# Patient Record
Sex: Female | Born: 2000 | Hispanic: Yes | Marital: Married | Smoking: Never smoker
Health system: Southern US, Community
[De-identification: ages and names within clinical notes are randomized; demographics above are authoritative.]

## PROBLEM LIST (undated history)

## (undated) DIAGNOSIS — Z789 Other specified health status: Secondary | ICD-10-CM

## (undated) HISTORY — PX: NO PAST SURGERIES: SHX2092

## (undated) HISTORY — DX: Other specified health status: Z78.9

---

## 2019-12-24 ENCOUNTER — Inpatient Hospital Stay (HOSPITAL_COMMUNITY)
Admission: AD | Admit: 2019-12-24 | Discharge: 2019-12-24 | Disposition: A | Discharge status: Home / Self Care | Payer: Self-pay | Attending: Obstetrics & Gynecology | Admitting: Obstetrics & Gynecology

## 2019-12-24 ENCOUNTER — Other Ambulatory Visit: Payer: Self-pay

## 2019-12-24 DIAGNOSIS — Z3202 Encounter for pregnancy test, result negative: Secondary | ICD-10-CM | POA: Insufficient documentation

## 2019-12-24 DIAGNOSIS — N939 Abnormal uterine and vaginal bleeding, unspecified: Secondary | ICD-10-CM | POA: Insufficient documentation

## 2019-12-24 LAB — POCT PREGNANCY, URINE: Preg Test, Ur: NEGATIVE

## 2019-12-24 LAB — HCG, QUANTITATIVE, PREGNANCY: hCG, Beta Chain, Quant, S: <1 m[IU]/mL (ref <5)

## 2019-12-24 NOTE — MAU Note (Signed)
+  HPT a couple days ago. Yesterday and today has been spotting, bright red today. Few small clots today. Not really pain, but uncomfortable in lower abd.

## 2019-12-24 NOTE — MAU Provider Note (Signed)
First Provider Initiated Contact with Patient 12/24/19 1707     S [name] is a 19 y.o. G0 P0 female who presents to MAU today with complaint of vaginal spotting in the setting of positive home pregnancy test.  O BP 113/66 (BP Location: Right Arm)   Pulse 93   Temp 99.3 F (37.4 C) (Oral)   Resp 16   Wt 67.1 kg   LMP 11/27/2019   SpO2 100%    Physical Exam  Nursing note and vitals reviewed. Constitutional: She is oriented to person, place, and time. She appears well-developed and well-nourished.  Cardiovascular: Normal rate and normal heart sounds.  Respiratory: Effort normal and breath sounds normal.  GI: Soft.  Neurological: She is alert and oriented to person, place, and time.  Skin: Skin is warm and dry.  Psychiatric: She has a normal mood and affect. Her behavior is normal. Judgment and thought content normal.   A Negative urine pregnancy test Quant hCG < 1 Medical screening exam complete  P Discharge from MAU in stable condition List of options for follow-up given  Warning signs for worsening condition that would warrant emergency follow-up discussed Patient may return to MAU as needed for pregnancy related complaints  Language barrier: stratus remote interpreter utilized for all patient interaction  Clayton Bibles, CNM 12/24/2019 7:10 PM

## 2020-05-25 ENCOUNTER — Other Ambulatory Visit: Payer: Self-pay | Admitting: Obstetrics & Gynecology

## 2020-05-25 DIAGNOSIS — Z3682 Encounter for antenatal screening for nuchal translucency: Secondary | ICD-10-CM

## 2020-05-25 DIAGNOSIS — Z3A10 10 weeks gestation of pregnancy: Secondary | ICD-10-CM

## 2020-06-12 ENCOUNTER — Encounter: Payer: Self-pay | Admitting: *Deleted

## 2020-06-17 ENCOUNTER — Ambulatory Visit: Payer: Self-pay

## 2020-06-17 ENCOUNTER — Other Ambulatory Visit: Payer: Self-pay

## 2020-06-18 ENCOUNTER — Ambulatory Visit: Payer: Self-pay | Attending: Obstetrics and Gynecology | Admitting: *Deleted

## 2020-06-18 ENCOUNTER — Other Ambulatory Visit: Payer: Self-pay

## 2020-06-18 ENCOUNTER — Ambulatory Visit (HOSPITAL_BASED_OUTPATIENT_CLINIC_OR_DEPARTMENT_OTHER): Payer: Self-pay

## 2020-06-18 ENCOUNTER — Ambulatory Visit: Payer: Self-pay

## 2020-06-18 VITALS — BP 111/76 | HR 95 | Wt 162.0 lb

## 2020-06-18 DIAGNOSIS — Z3A10 10 weeks gestation of pregnancy: Secondary | ICD-10-CM

## 2020-06-18 DIAGNOSIS — Z3682 Encounter for antenatal screening for nuchal translucency: Secondary | ICD-10-CM

## 2020-06-20 LAB — FIRST TRIMESTER SCREEN W/NT
CRL: 81.6 mm
DIA MoM: 2.16
DIA Value: 391.2 pg/mL
Gest Age-Collect: 13.7 weeks
Maternal Age At EDD: 22.1 yr
Nuchal Translucency MoM: 1.43
Nuchal Translucency: 2.6 mm
Number of Fetuses: 1
PAPP-A MoM: 1.51
PAPP-A Value: 2107.4 ng/mL
Test Results:: NEGATIVE
Weight: 162 [lb_av]
hCG MoM: 1.8
hCG Value: 130.1 IU/mL

## 2020-06-23 ENCOUNTER — Ambulatory Visit: Payer: Self-pay | Admitting: Genetic Counselor

## 2020-06-23 ENCOUNTER — Ambulatory Visit: Payer: Self-pay | Attending: Obstetrics and Gynecology | Admitting: Genetic Counselor

## 2020-06-23 ENCOUNTER — Other Ambulatory Visit: Payer: Self-pay

## 2020-06-23 DIAGNOSIS — Z315 Encounter for genetic counseling: Secondary | ICD-10-CM

## 2020-06-23 DIAGNOSIS — O09299 Supervision of pregnancy with other poor reproductive or obstetric history, unspecified trimester: Secondary | ICD-10-CM

## 2020-06-23 NOTE — Progress Notes (Signed)
06/23/2020  Alyssa Morrison 2000/12/29 MRN: [id number] DOV: 06/23/2020  Alyssa Morrison [facility] for a genetics consultation regarding her history of a previous fetus with multiple anomalies. Alyssa Morrison presented to her appointment alone. This session was facilitated by a [facility] Spanish interpreter.   Indication for genetic counseling - Previous fetus with multiple anomalies   Prenatal history  Alyssa Morrison is a G2P0010, 19 y.o. female. Her current pregnancy has completed 3440w1d (Estimated Date of Delivery: 12/21/20). This pregnancy is Alyssa Morrison'Alyssa Morrison second with her current partner. Her previous pregnancy resulted in a miscarriage at 2317 weeks' gestation per Alyssa Morrison.  Alyssa Morrison denied exposure to environmental toxins or chemical agents. She denied the use of alcohol, tobacco or street drugs. She reported taking prenatal vitamins. She denied significant viral illnesses, fevers, and bleeding during the course of her pregnancy. Her medical and surgical histories were noncontributory.  Family History  A three generation pedigree was drafted and reviewed. The family history is remarkable for the following:  - Alyssa Morrison had a previous pregnancy that was affected by multiple anomalies, including "water on the brain" (hydrocephalus) and "throat deformities". This pregnancy resulted in miscarriage at 7317 weeks' gestation. Genetic testing was not performed to assess for a cause of the anomalies. See Discussion section for more details  - Alyssa Morrison has a 21 year old brother with Down syndrome. He has mild seizures but is reportedly doing well otherwise. Alyssa Morrison believes that her mother had her brother when she was around 19 years of age. We reviewed that Down syndrome is most often caused by an entire extra copy of chromosome 21. This occurs due to errors in chromosomal division  during the creation of egg and sperm cells in a process called nondisjunction. Given that [name]'Alyssa Morrison mother was considered to be of advanced maternal age when she had her son, it is likely that Down syndrome occurred due to a random nondisjunction event. If this is the case, it is not expected that there would be an increased risk of recurrence for Down syndrome in [name]'Alyssa Morrison children. However, all women have age-related risks to have a child with a chromosomal aneuploidy such as Down syndrome, and screening is available to assess the risk for Down syndrome in a pregnancy. See Discussion section for more details.  The remaining family histories were reviewed and found to be noncontributory for birth defects, intellectual disability, recurrent pregnancy loss, and known genetic conditions.    The patient'Alyssa Morrison ethnicity is [place]. The father of the pregnancy'Alyssa Morrison ethnicity is [place]. Ashkenazi Jewish ancestry was denied. Alyssa Morrison reported that her paternal grandfather is cousins with her partner'Alyssa Morrison father. The precise degree of relation between them is not known. Pedigree will be scanned under Media.  Discussion  Previous pregnancy with fetal anomalies:  Alyssa Morrison was referred for genetic counseling due to her history of a previous pregnancy with fetal anomalies. The fetus in her first pregnancy reportedly had "throat deformities" and hydrocephalus.  This pregnancy resulted in miscarriage at 7017 weeks' gestation. This pregnancy occurred in [place] so records are unavailable and details are limited. Alyssa Morrison does not believe that genetic testing to assess the etiology of the fetal anomalies was performed. For this reason, a genetic cause for the anomalies cannot be ruled out.   We discussed that if her pregnancy had anomalies caused by a genetic  condition, there are multiple ways it could have been inherited. Firstly, some genetic conditions are inherited in an  autosomal dominant fashion. Autosomal dominant conditions only require a pathogenic variant to be present in one copy of a gene to cause the condition. Secondly, some genetic conditions are inherited in an autosomal recessive fashion. Both parents must be carriers of the same recessive condition in order to be at risk of having an affected child. If both [name] and her husband are carriers for the same condition, they would have a 1 in 4 (25%) chance of having a child affected by that condition with each pregnancy. Thirdly, some conditions are inherited in an X-linked fashion. X-linked conditions occur due to a pathogenic variant in a gene located on the X chromosome. X-linked conditions often affected males more severely than females, as males only have one X chromosome whereas females have two. [name] was not sure if her previous fetus was a female or a female. Finally, some genetic conditions are de novo, or new, occurring for the first time in an affected individual. Since a genetic cause was not identified for the fetal anomalies in [name]'Alyssa Morrison first pregnancy, risk assessment is limited. [name] was counseled that recurrence risk could be as high as 50% in the cases of autosomal dominant conditions or X-linked conditions in female fetuses. However, if [name] first fetus had a recessive or de novo genetic condition, or if the fetus'Alyssa Morrison anomalies were isolated, risk of recurrence for future pregnancies would be lower. We discussed that precise recurrence risks and prognosis for future pregnancies depend on the underlying etiology of the fetal anomalies in the first pregnancy.  Additional testing options:  We discussed that expanded carrier screening (ECS) is a testing option that evaluates an individual'Alyssa Morrison carrier status for a wide range of genetic conditions. The severity of the conditions included on ECS can differ. Some conditions may be actionable  whereas others may not have treatments available. We discussed that ECS panels can include more than 200 autosomal recessive or X-linked genetic conditions. Some of these conditions may be associated with hydrocephalus and other anomalies; however, not all genetic conditions associated with hydrocephalus or other fetal anomalies are included on ECS. We reviewed that in the event that one partner were found to be a carrier for one or more recessive conditions on ECS, carrier screening would be recommended for their partner for those conditions. ECS could help determine whether her current and future pregnancies are at increased risk for a particular recessive or X-linked genetic condition. After thoughtful consideration, [name] declined ECS at this time.  We also discussed that [name] will have an anatomy ultrasound performed around 18-20 weeks' gestation. The anatomy ultrasound is targeted at identifying congenital birth defects as well as features associated with chromosomal aneuploidies. I informed [name] that it is recommended that she have her anatomy ultrasound performed in Maternal Fetal Medicine (MFM) given her history of fetal anomalies. However, she may also have an ultrasound performed at her OBGYN'Alyssa Morrison office and can be referred back to MFM if any anomalies are suspected or identified.  First trimester screening results:  We reviewed that [name] had first trimester screening for Down syndrome and trisomy 18 that was negative. The risk for the pregnancy to be affected by trisomy 18 decreased from her 1 in 1988 age-related risk to less than 1 in 10,000 based on the results of this screen.  The risk for the pregnancy to be affected by Down syndrome increased slightly above her 1 in 1015 age-related risk to 1 in 840 (0.1%); however, this still falls below the 1 in 250 risk cutoff for first trimester screening, so results are considered negative.   We  discussed that first trimester screening measures levels of hormones that are made by the body during pregnancy. One of these hormones is called inhibin, or DIA. This is a hormone present in the fetus and the placenta during pregnancy. [name]'Alyssa Morrison DIA level was increased (2.16 MoM) on first trimester screening. We discussed that increased levels of DIA can be seen in pregnancies affected by Down syndrome, but can also be associated with preeclampsia, fetal growth restriction, preterm birth, and fetal loss. Thus, even if the fetus does not have Down syndrome, the pregnancy may be at risk for other adverse obstetrical outcomes.   [name] was reminded that while this screen significantly reduced the likelihood of the pregnancy being affected by trisomy 18 and adjusted the risk for trisomy 21 in the pregnancy, it cannot be considered diagnostic. Additionally, first trimester screening does not assess for open neural tube defects (ONTDs) such as spina bifida. For this reason, it is recommended that MS-AFP screening be ordered around 16-18 weeks' gestation to screen for ONTDs.  Diagnostic testing:  [name] was also counseled regarding diagnostic testing via amniocentesis available after 16 weeks' gestation. We discussed the technical aspects of the procedure and quoted up to a 1 in 500 (0.2%) risk for spontaneous pregnancy loss or other adverse pregnancy outcomes as a result of amniocentesis. Cultured cells from an amniocentesis sample allow for the visualization of a fetal karyotype, which can detect >99% of chromosomal aberrations. Chromosomal microarray can also be performed to identify smaller deletions or duplications of fetal chromosomal material. Amniocentesis samples can also be used to test for other genetic conditions. However, it is difficult to determine which tests to order without knowing the precise cause of [name]'Alyssa Morrison previous fetal anomalies. Ms. Smt Lokey informed me that she is not interested in pursuing diagnostic testing. She understands that amniocentesis is available at any point after 16 weeks of pregnancy and that she may opt to undergo the procedure at a later date should she change her mind.  Plan:  We discussed that it is recommended that Ms. Florenda Watt have her anatomy ultrasound performed here in Maternal Fetal Medicine given her history of fetal anomalies. She informed me that she recently received her card for Medicaid; however, upon review of this card, it was not for the [facility] plan that provides coverage during pregnancy. If she gains pregnancy Medicaid coverage, she would like to have her anatomy ultrasound performed here. I encouraged her to contact Medicaid to have her coverage switched over to the correct plan. I will check in on her in a couple of weeks and we can schedule her for an anatomy ultrasound if desired at that time.  I counseled Ms. Dayle Sherpa regarding the above risks and available options. The approximate face-to-face time with the genetic counselor was 50 minutes.  In summary:  Discussed previous pregnancy history options for follow-up testing  Previous fetus with hydrocephalus and "throat deformities" that ended in miscarriage at 68 weeks' gestation  No genetic testing performed to assess for the cause of the fetal anomalies  Declined expanded carrier screening  Reviewed results from first trimester screening  Reduction in risk for trisomy 29  Risk for  trisomy 21 increased slightly above age-related risk to ~0.1%  Inhibin (DIA) levels slightly increased, which can be associated with preeclampsia, fetal growth restriction, preterm birth, and fetal loss  Offered additional testing and screening  Not interested in pursuing amniocentesis  Recommend having anatomy ultrasound with MFM if patient is able to switch her insurance coverage to the pregnancy Medicaid plan  Reviewed family  history concerns   Gershon Crane, MS, Aeronautical engineer

## 2020-07-01 ENCOUNTER — Other Ambulatory Visit: Payer: Self-pay

## 2022-01-09 IMAGING — US US MFM FETAL NUCHAL TRANSLUCENCY
1 series · 14 of 28 positions shown · non-contrast
Comparison: none

[Series 1: us mfm fetal nuchal translucency · 14 of 72 slices shown]
[im 3/72]
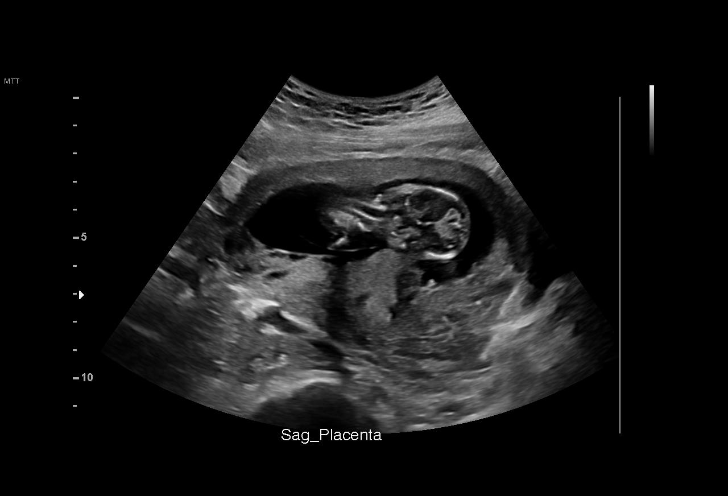
[im 8/72]
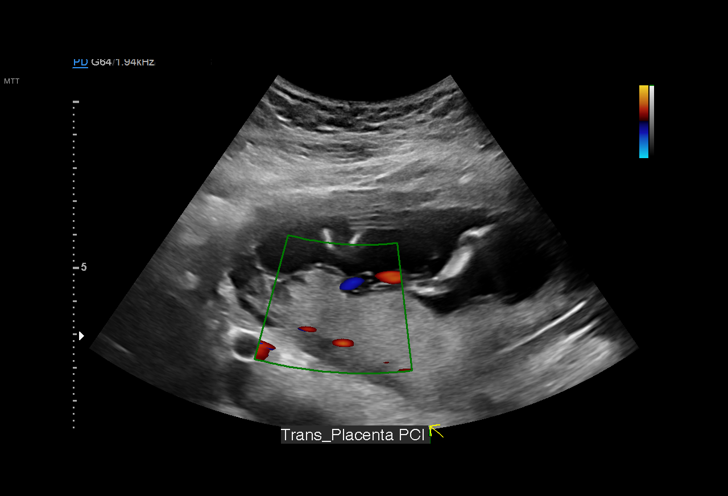
[im 14/72]
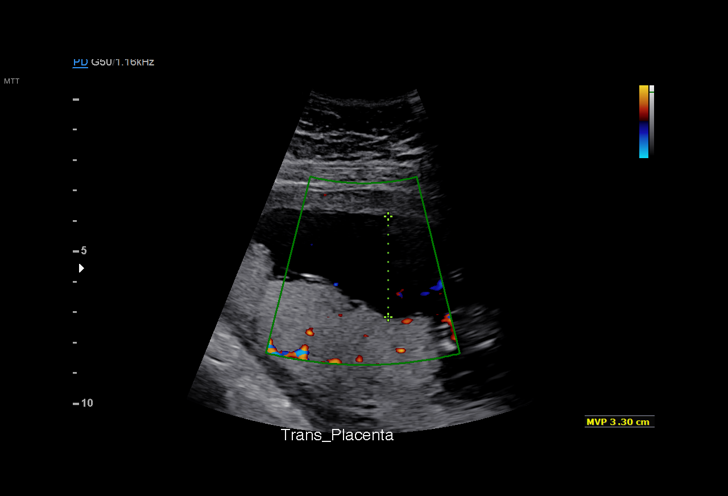
[im 19/72]
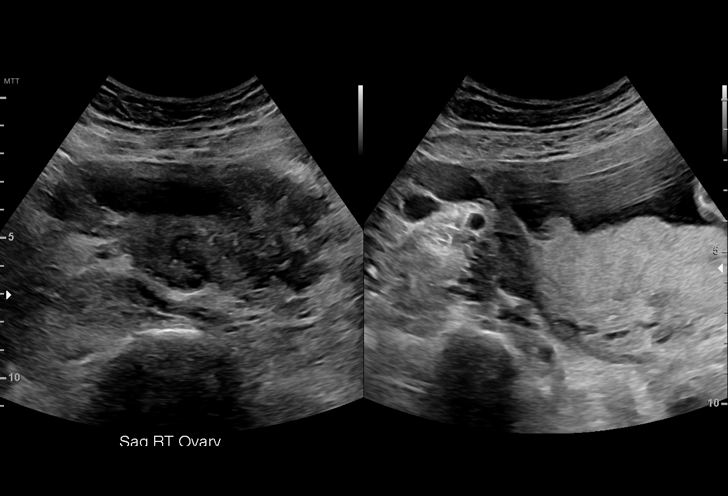
[im 24/72]
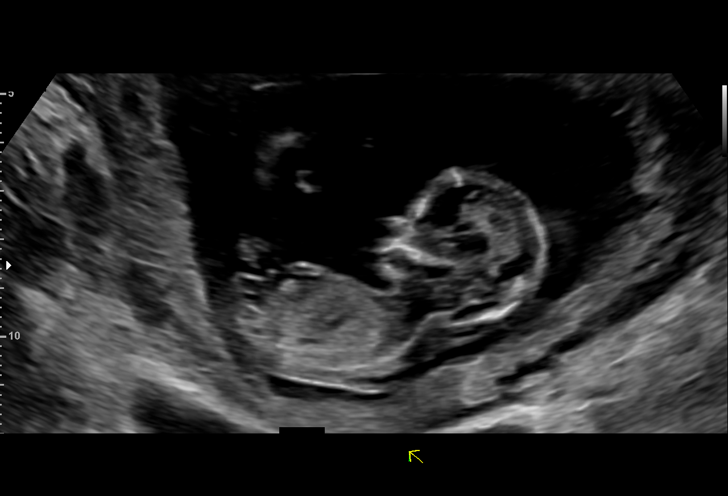
[im 29/72]
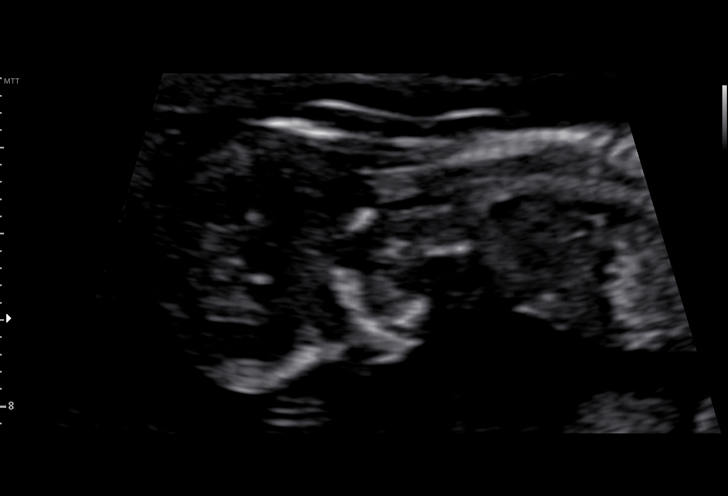
[im 35/72]
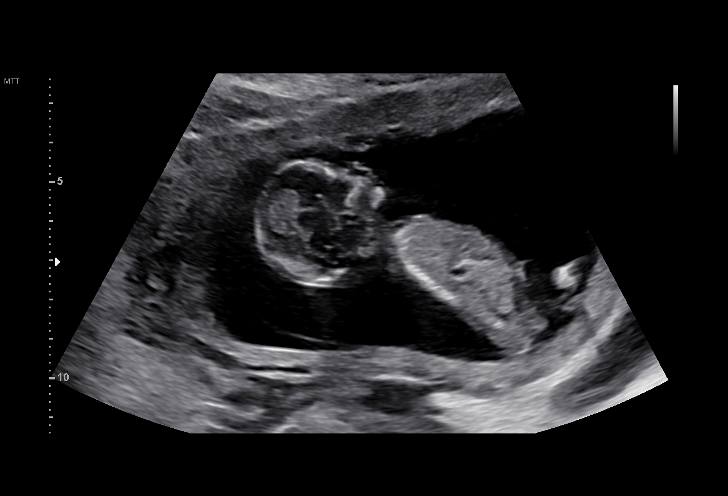
[im 40/72]
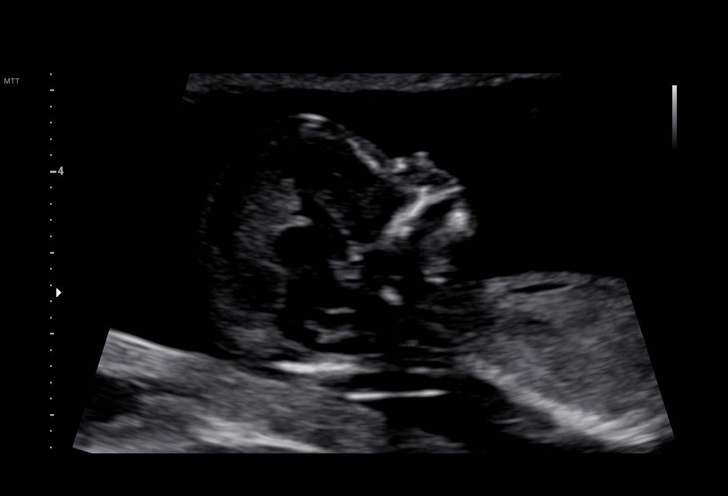
[im 45/72]
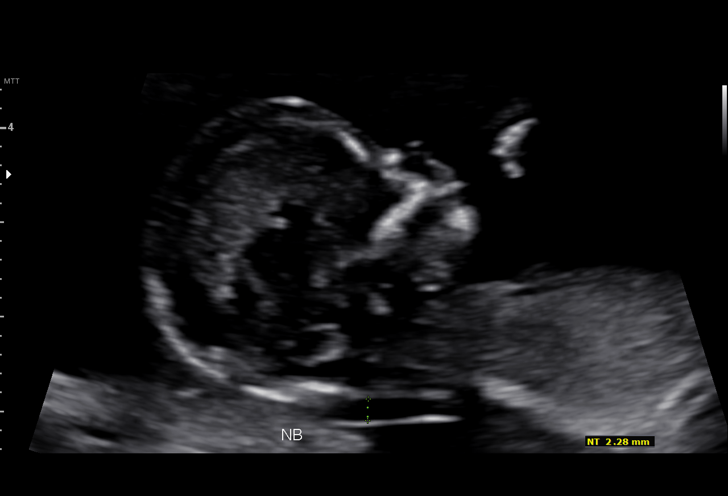
[im 50/72]
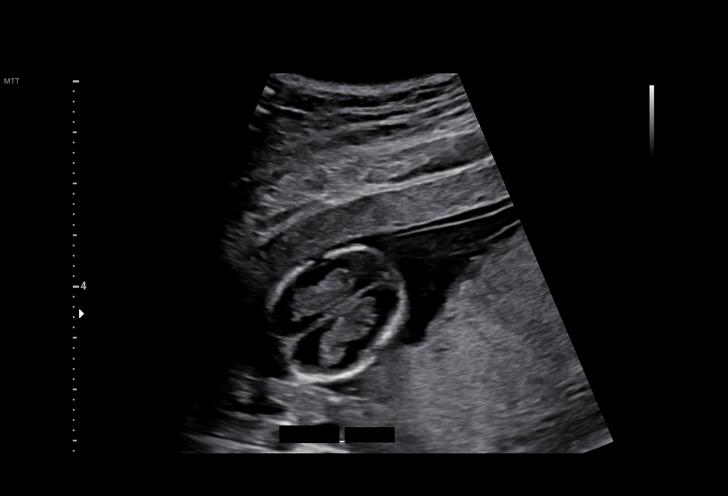
[im 56/72]
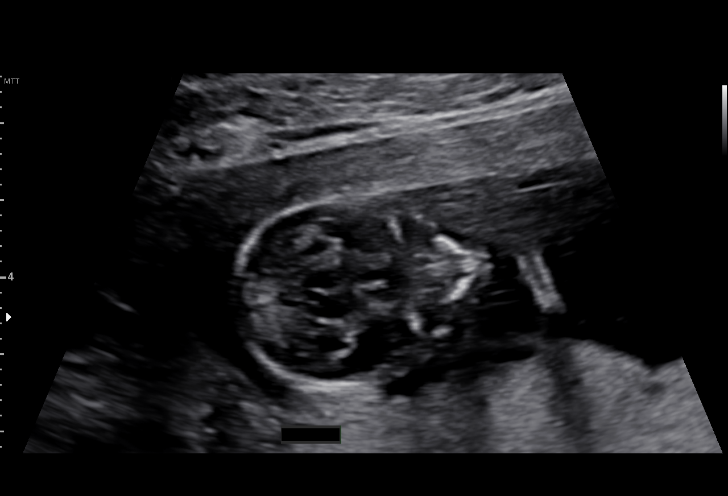
[im 61/72]
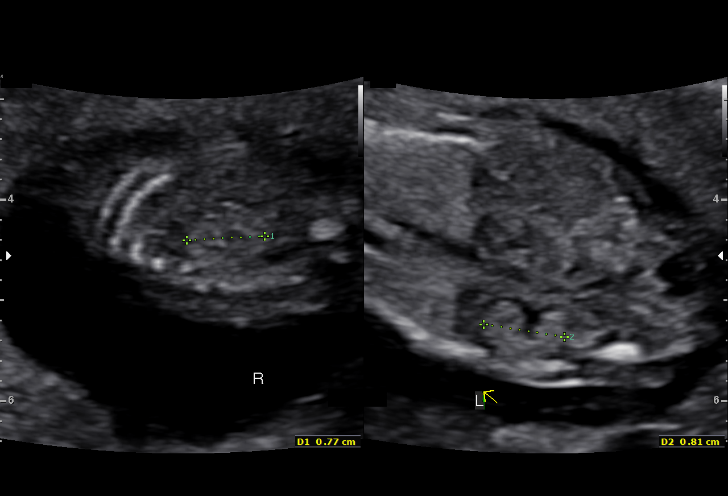
[im 66/72]
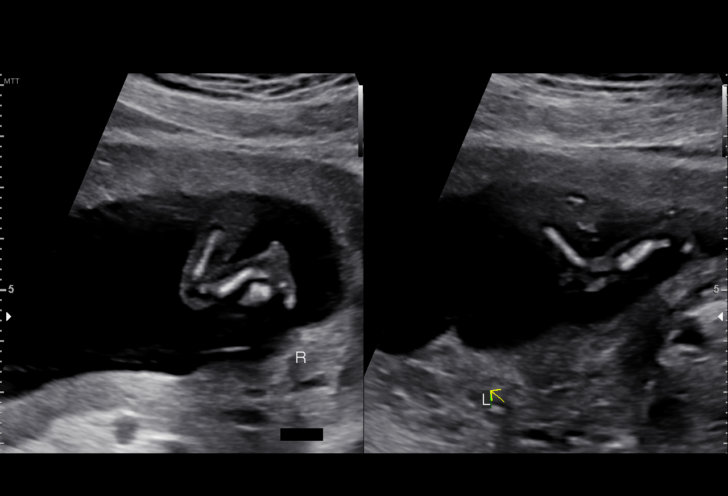
[im 72/72]
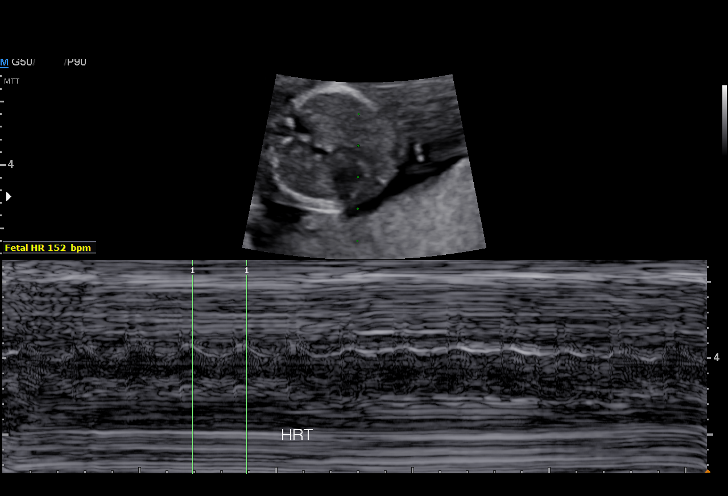

[14 of 28 positions shown; findings below may reference images not displayed]

[REDACTED] Health-
                   Faculty Physician

    TRANSLUCENCY

Indications

 13 weeks gestation of pregnancy
 Previous child with congenital anomaly,
 antepartum
 Encounter for nuchal translucency
Fetal Evaluation

 Num Of Fetuses:         1
 Fetal Heart Rate(bpm):  152
 Cardiac Activity:       Observed
 Presentation:           Variable
 Placenta:               Posterior
 P. Cord Insertion:      Visualized

 Amniotic Fluid
 AFI FV:      Within normal limits

                             Largest Pocket(cm)

OB History

 Blood Type:   A+
 Gravidity:    2         Term:   0        Prem:   0        SAB:   1
 TOP:          0       Ectopic:  0        Living: 0
Gestational Age

 LMP:           13w 3d        Date:  03/16/20                 EDD:   12/21/20
 Best:          13w 3d     Det. By:  LMP  (03/16/20)          EDD:   12/21/20
1st Trimester Genetic Sonogram Screening

 CRL:           81.55  mm    G. Age:   13w 5d                 EDD:   12/19/20
 Nuc Trans:       2.6  mm

 Nasal Bone:                 Present
Anatomy

 Cranium:               Visualized             Kidneys:                Visualized
 Choroid Plexus:        Visualized             Bladder:                Visualized
 Heart:                 Visualized             Upper Extremities:      Visualized
 Diaphragm:             Visualized             Lower Extremities:      Visualized
 Stomach:               Visualized

 Other:  Technically difficult due to early gestational age.
Cervix Uterus Adnexa

 Cervix
 Normal appearance by transabdominal scan.

 Uterus
 No abnormality visualized.

 Right Ovary
 Within normal limits. No adnexal mass visualized.

 Left Ovary
 Within normal limits. No adnexal mass visualized.

 Cul De Sac
 No free fluid seen.

 Adnexa
 No abnormality visualized.
Impression

 G2 P0.  Patient is here for first trimester screening.
 On ultrasound, the CRL measurement is consistent with her
 previously-established dates and good fetal heart activity is
 seen. The nuchal translucency (NT) measures
 millimeters, which is normal.  Fetal anatomy that could be
 ascertained at this gestational age is normal.

 Using [employer] interpreter services, I counseled the patient on
 the significance and limitations of which demonstrate
 screening.  Patient opted to have her established screening.
 She was sent over to the lab to have her blood drawn.

 She has an appointment on 06/23/2020 with our genetic
 counselor and her results will be discussed at counseling.
Recommendations

 Fetal anatomy scan at 18-20 weeks.
                 BORJ

## 2024-07-15 LAB — OB RESULTS CONSOLE ABO/RH: RH Type: POSITIVE

## 2024-07-15 LAB — OB RESULTS CONSOLE GC/CHLAMYDIA
Chlamydia: NEGATIVE
Neisseria Gonorrhea: NEGATIVE

## 2024-07-15 LAB — OB RESULTS CONSOLE PLATELET COUNT: Platelets: 230

## 2024-07-15 LAB — OB RESULTS CONSOLE RPR: RPR: NONREACTIVE

## 2024-07-15 LAB — OB RESULTS CONSOLE HGB/HCT, BLOOD
HCT: 36 (ref 29–41)
Hemoglobin: 12.8

## 2024-07-15 LAB — OB RESULTS CONSOLE HIV ANTIBODY (ROUTINE TESTING): HIV: NONREACTIVE

## 2024-07-15 LAB — OB RESULTS CONSOLE RUBELLA ANTIBODY, IGM: Rubella: IMMUNE

## 2024-07-15 LAB — OB RESULTS CONSOLE ANTIBODY SCREEN: Antibody Screen: NEGATIVE

## 2024-07-15 LAB — OB RESULTS CONSOLE HEPATITIS B SURFACE ANTIGEN: Hepatitis B Surface Ag: NEGATIVE

## 2024-07-15 LAB — HEPATITIS C ANTIBODY: HCV Ab: NEGATIVE

## 2024-07-17 ENCOUNTER — Other Ambulatory Visit: Payer: Self-pay | Admitting: Nurse Practitioner

## 2024-07-17 DIAGNOSIS — Z36 Encounter for antenatal screening for chromosomal anomalies: Secondary | ICD-10-CM

## 2024-07-17 LAB — GLUCOSE TOLERANCE, 1 HOUR: Glucose, GTT - 1 Hour: 97 (ref ?–200)

## 2024-07-22 LAB — CYTOLOGY - PAP: Pap: NEGATIVE

## 2024-08-13 ENCOUNTER — Encounter: Payer: Self-pay | Admitting: Obstetrics and Gynecology

## 2024-08-13 ENCOUNTER — Ambulatory Visit: Payer: Self-pay | Admitting: Obstetrics and Gynecology

## 2024-08-13 VITALS — BP 111/71 | HR 88 | Wt 156.1 lb

## 2024-08-13 DIAGNOSIS — O09892 Supervision of other high risk pregnancies, second trimester: Secondary | ICD-10-CM

## 2024-08-13 DIAGNOSIS — O09899 Supervision of other high risk pregnancies, unspecified trimester: Secondary | ICD-10-CM | POA: Insufficient documentation

## 2024-08-13 DIAGNOSIS — O0990 Supervision of high risk pregnancy, unspecified, unspecified trimester: Secondary | ICD-10-CM | POA: Insufficient documentation

## 2024-08-13 DIAGNOSIS — Z3A15 15 weeks gestation of pregnancy: Secondary | ICD-10-CM

## 2024-08-13 DIAGNOSIS — O0992 Supervision of high risk pregnancy, unspecified, second trimester: Secondary | ICD-10-CM

## 2024-08-13 NOTE — Progress Notes (Signed)
 INITIAL PRENATAL VISIT NOTE  Subjective:  Alyssa Morrison is a 23 y.o. G3P1011 at [redacted]w[redacted]d by LMP being seen today for her initial prenatal visit. She has an obstetric history significant for preterm delivery at 36 weeks. She has an uncomplicated medical history.  Patient reports no complaints.   . Vag. Bleeding: None.   . Denies leaking of fluid.    Past Medical History:  Diagnosis Date   Medical history non-contributory     Past Surgical History:  Procedure Laterality Date   NO PAST SURGERIES      OB History  Gravida Para Term Preterm AB Living  3 1 1  1 1   SAB IAB Ectopic Multiple Live Births  1    1    # Outcome Date GA Lbr Len/2nd Weight Sex Type Anes PTL Lv  3 Current           2 Term 12/03/20 [redacted]w[redacted]d   F   Y LIV  1 SAB             Social History   Socioeconomic History   Marital status: Married    Spouse name: Not on file   Number of children: Not on file   Years of education: Not on file   Highest education level: Not on file  Occupational History   Not on file  Tobacco Use   Smoking status: Never   Smokeless tobacco: Never  Substance and Sexual Activity   Alcohol use: Not Currently   Drug use: Never   Sexual activity: Not on file  Other Topics Concern   Not on file  Social History Narrative   Not on file   Social Drivers of Health   Financial Resource Strain: Not on file  Food Insecurity: Not on file  Transportation Needs: Not on file  Physical Activity: Not on file  Stress: Not on file  Social Connections: Not on file    No family history on file.   Current Outpatient Medications:    Prenatal Vit w/Fe-Methylfol-FA (PNV PO), Take by mouth., Disp: , Rfl:    Doxylamine Succinate, Sleep, (UNISOM PO), Take by mouth. (Patient not taking: Reported on 08/13/2024), Disp: , Rfl:    Pyridoxine HCl (B-6 PO), Take by mouth. (Patient not taking: Reported on 08/13/2024), Disp: , Rfl:   No Known Allergies  Review of Systems: Negative except for  what is mentioned in HPI.  Objective:   Vitals:   08/13/24 1537  BP: 111/71  Pulse: 88  Weight: 156 lb 1.6 oz (70.8 kg)    Fetal Status: Fetal Heart Rate (bpm): 149         Physical Exam: BP 111/71   Pulse 88   Wt 156 lb 1.6 oz (70.8 kg)   LMP 04/26/2024   BMI 25.39 kg/m  CONSTITUTIONAL: Well-developed, well-nourished female in no acute distress.  NEUROLOGIC: Alert and oriented to person, place, and time. Normal reflexes, muscle tone coordination. No cranial nerve deficit noted. PSYCHIATRIC: Normal mood and affect. Normal behavior. Normal judgment and thought content. SKIN: Skin is warm and dry. No rash noted. Not diaphoretic. No erythema. No pallor. HENT:  Normocephalic, atraumatic, External right and left ear normal. Oropharynx is clear and moist EYES: Conjunctivae and EOM are normal.  NECK: Normal range of motion, supple, no masses CARDIOVASCULAR: Normal heart rate noted, regular rhythm RESPIRATORY: Effort and breath sounds normal, no problems with respiration noted BREASTS: deferred ABDOMEN: Soft, nontender, nondistended, gravid. GU: deferred MUSCULOSKELETAL: Normal range of motion. EXT:  No edema and no tenderness. 2+ distal pulses.   Assessment and Plan:  Pregnancy: G3P1011 at [redacted]w[redacted]d by LMP  1. [redacted] weeks gestation of pregnancy (Primary)  - AFP, Serum, Open Spina Bifida  2. Supervision of high risk pregnancy, antepartum Continue routine prenatal care  3. History of preterm delivery, currently pregnant Delivery at 36 weeks, no hx of cervical shortening, pt noted PROM before delivery, will monitor   Preterm labor symptoms and general obstetric precautions including but not limited to vaginal bleeding, contractions, leaking of fluid and fetal movement were reviewed in detail with the patient.  Please refer to After Visit Summary for other counseling recommendations.   Return in about 4 weeks (around 09/10/2024) for [facility], in person.  Jerilynn DELENA Buddle 08/13/2024 3:56 PM

## 2024-08-14 DIAGNOSIS — O0990 Supervision of high risk pregnancy, unspecified, unspecified trimester: Secondary | ICD-10-CM

## 2024-08-15 ENCOUNTER — Ambulatory Visit: Payer: Self-pay | Admitting: Obstetrics and Gynecology

## 2024-08-15 LAB — AFP, SERUM, OPEN SPINA BIFIDA
AFP MoM: 0.7
AFP Value: 19.5 ng/mL
Gest. Age on Collection Date: 15 wk
Maternal Age At EDD: 26.2 a
OSBR Risk 1 IN: 10000
Test Results:: NEGATIVE
Weight: 156 [lb_av]

## 2024-09-10 ENCOUNTER — Ambulatory Visit: Payer: Self-pay | Attending: Obstetrics and Gynecology

## 2024-09-10 ENCOUNTER — Encounter: Payer: Self-pay | Admitting: Obstetrics and Gynecology

## 2024-09-10 ENCOUNTER — Ambulatory Visit: Payer: Self-pay

## 2024-09-10 ENCOUNTER — Other Ambulatory Visit: Payer: Self-pay

## 2024-09-10 ENCOUNTER — Ambulatory Visit: Payer: Self-pay | Admitting: Obstetrics and Gynecology

## 2024-09-10 VITALS — BP 113/53

## 2024-09-10 VITALS — BP 99/66 | HR 74 | Wt 161.0 lb

## 2024-09-10 DIAGNOSIS — Z3A19 19 weeks gestation of pregnancy: Secondary | ICD-10-CM | POA: Insufficient documentation

## 2024-09-10 DIAGNOSIS — Z36 Encounter for antenatal screening for chromosomal anomalies: Secondary | ICD-10-CM

## 2024-09-10 DIAGNOSIS — O3503X0 Maternal care for (suspected) central nervous system malformation or damage in fetus, choroid plexus cysts, not applicable or unspecified: Secondary | ICD-10-CM | POA: Insufficient documentation

## 2024-09-10 DIAGNOSIS — O352XX0 Maternal care for (suspected) hereditary disease in fetus, not applicable or unspecified: Secondary | ICD-10-CM | POA: Insufficient documentation

## 2024-09-10 DIAGNOSIS — Z8279 Family history of other congenital malformations, deformations and chromosomal abnormalities: Secondary | ICD-10-CM | POA: Insufficient documentation

## 2024-09-10 DIAGNOSIS — O358XX0 Maternal care for other (suspected) fetal abnormality and damage, not applicable or unspecified: Secondary | ICD-10-CM | POA: Insufficient documentation

## 2024-09-10 DIAGNOSIS — O0990 Supervision of high risk pregnancy, unspecified, unspecified trimester: Secondary | ICD-10-CM

## 2024-09-10 DIAGNOSIS — O09899 Supervision of other high risk pregnancies, unspecified trimester: Secondary | ICD-10-CM

## 2024-09-10 DIAGNOSIS — O09299 Supervision of pregnancy with other poor reproductive or obstetric history, unspecified trimester: Secondary | ICD-10-CM | POA: Insufficient documentation

## 2024-09-10 DIAGNOSIS — Z603 Acculturation difficulty: Secondary | ICD-10-CM | POA: Insufficient documentation

## 2024-09-10 DIAGNOSIS — Z363 Encounter for antenatal screening for malformations: Secondary | ICD-10-CM | POA: Insufficient documentation

## 2024-09-10 NOTE — Progress Notes (Signed)
 Patient information  Patient Name: Alyssa Morrison  Patient MRN:   [id number]  Referring practice: MFM Referring Provider: [facility])  Problem List   Patient Active Problem List   Diagnosis Date Noted   Language barrier 09/10/2024   H/O delivery by vacuum extraction, currently pregnant 09/10/2024   Choroid plexus cyst of fetus affecting care of mother, antepartum 09/10/2024   Supervision of high risk pregnancy, antepartum 08/13/2024   History of preterm delivery, currently pregnant 08/13/2024   Maternal Fetal Medicine Consult Alyssa Morrison is a 23 y.o. G3P1011 at [redacted]w[redacted]d here for ultrasound and consultation.    Today we focused on the following:   The patient presents today for a detailed anatomy ultrasound. Her pregnancy is notable for a remote history of an induced abortion in her first pregnancy for multiple fetal anomalies, though she is unable to recall the specific findings; this occurred approximately nine years ago. Her second pregnancy resulted in a 37-week vaginal delivery requiring vacuum assistance, and the neonate did well without complications. She reports no current pregnancy concerns.   Choroid plexus cyst (CPC) I discussed the finding of left sided choroid plexus cyst was noted in the lateral ventricle of the fetal brain. There are no other structural abnormalities and management depends on the presence of absence of aneuploidy screening.  The patient had low risk aneuploidy screening at her OB provider's office.  In this context, a CPC is considered a normal variant and no further workup is needed at this time.  The Society for maternal-fetal medicine does not recommend amniocentesis for isolated choroid plexus cysts in the setting of low risk aneuploidy screening.  I reassured the patient that this should be interpreted as a normal variant and does not have any adverse impact on her fetus.  CPCs are fluid-filled structures in  the choroid plexus. CPCs may be single or multiple, unilateral or bilateral, and most often are <1 cm in diameter. CPCs are identified in approximately 1% to 2% of fetuses in the second trimester of pregnancy and are commonly isolated findings in fetuses with euploid. CPCs are associated with trisomy 11, as they are present in 30% to 50% of fetuses with this disorder.62 When a fetus is affected by trisomy 18, multiple structural anomalies are almost always evident, including structural heart defects, clenched hands, talipes deformity of the feet, FGR, and polyhydramnios. When a structural anomaly is present in addition to CPCs, the positive LR is 66. In the absence of other ultrasonographic abnormalities, the likelihood of trisomy 76 with isolated CPCs is much lower. Early studies suggested that isolated CPCs conferred a 1 in 200 to 1 in 400 risk of trisomy 54. More recent studies among women with isolated CPCs suggest a range of positive LRs for trisomy 18, from 0.9 to 5.6, with most studies suggesting low risk. Based on the literature, the best estimate for the LR is <2, suggesting a minimal risk. The presence of a CPC does not alter the risk of trisomy 51. Ultrasound characteristics of CPCs (size, complexity, laterality, and persistence) should not be used to modify risk further because these factors do not impact the likelihood of trisomy 18.  A CPC is not considered a structural or functional brain abnormality, and nearly all CPCs resolve by 28 weeks. Neurodevelopmental outcomes in children with euploidy born after a prenatal diagnosis of CPCs have not shown differences in neurocognitive ability, motor function, or behavior.  Recommendations No further workup is needed  at this time.  Follow-up with OB provider.  An in person interpreter was used in the patient's language of preference for today's visit.   Review of Systems: A review of systems was performed and was negative except per HPI   Past  Obstetrical History:  OB History  Gravida Para Term Preterm AB Living  3 1 1  1 1   SAB IAB Ectopic Multiple Live Births  0 1   1    # Outcome Date GA Lbr Len/2nd Weight Sex Type Anes PTL Lv  3 Current           2 Term 12/03/20 [redacted]w[redacted]d 07:32 / 02:18 6 lb 10.2 oz (3.011 kg) F Vag-Vacuum None N LIV  1 IAB 03/09/15 [redacted]w[redacted]d            Birth Comments: Fetal anomaly     Past Medical History:  Past Medical History:  Diagnosis Date   Medical history non-contributory      Past Surgical History:    Past Surgical History:  Procedure Laterality Date   dilation and curretage     INDUCED ABORTION     NO PAST SURGERIES       Home Medications:   Current Outpatient Medications on File Prior to Visit  Medication Sig Dispense Refill   Prenatal Vit w/Fe-Methylfol-FA (PNV PO) Take by mouth.     Doxylamine Succinate, Sleep, (UNISOM PO) Take by mouth. (Patient not taking: Reported on 09/10/2024)     Pyridoxine HCl (B-6 PO) Take by mouth. (Patient not taking: Reported on 09/10/2024)     No current facility-administered medications on file prior to visit.      Allergies:   No Known Allergies   Physical Exam:   Vitals:   09/10/24 0926  BP: (!) 113/53   Sitting comfortably on the sonogram table Nonlabored breathing Normal rate and rhythm Abdomen is nontender  Thank you for the opportunity to be involved with this patient's care. Please let us  know if we can be of any further assistance.   45 minutes of time was spent reviewing the patient's chart including labs, imaging and documentation.  At least 50% of this time was spent with direct patient care discussing the diagnosis, management and prognosis of her care.  Delora Smaller MFM, [facility]   09/10/2024  2:59 PM

## 2024-09-10 NOTE — Progress Notes (Signed)
   PRENATAL VISIT NOTE  Subjective:  Alyssa Morrison is a 23 y.o. G3P1011 at [redacted]w[redacted]d being seen today for ongoing prenatal care.  She is currently monitored for the following issues for this high-risk pregnancy and has Supervision of high risk pregnancy, antepartum; History of preterm delivery, currently pregnant; Language barrier; H/O delivery by vacuum extraction, currently pregnant; and Choroid plexus cyst of fetus affecting care of mother, antepartum on their problem list.  Patient reports no complaints.  Contractions: Not present. Vag. Bleeding: None.  Movement: Present. Denies leaking of fluid.   The following portions of the patient's history were reviewed and updated as appropriate: allergies, current medications, past family history, past medical history, past social history, past surgical history and problem list.   Objective:   Vitals:   09/10/24 1424  BP: 99/66  Pulse: 74  Weight: 161 lb (73 kg)    Fetal Status:  Fetal Heart Rate (bpm): 154   Movement: Present    General: Alert, oriented and cooperative. Patient is in no acute distress.  Skin: Skin is warm and dry. No rash noted.   Cardiovascular: Normal heart rate noted  Respiratory: Normal respiratory effort, no problems with respiration noted  Abdomen: Soft, gravid, appropriate for gestational age.  Pain/Pressure: Absent     Pelvic: Cervical exam deferred        Extremities: Normal range of motion.  Edema: None  Mental Status: Normal mood and affect. Normal behavior. Normal judgment and thought content.      08/13/2024    4:22 PM  Depression screen PHQ 2/9  Decreased Interest 0  Down, Depressed, Hopeless 0  PHQ - 2 Score 0  Altered sleeping 0  Tired, decreased energy 0  Change in appetite 0  Feeling bad or failure about yourself  0  Trouble concentrating 0  Moving slowly or fidgety/restless 0  Suicidal thoughts 0  PHQ-9 Score 0      Data saved with a previous flowsheet row definition         08/13/2024    4:22 PM  GAD 7 : Generalized Anxiety Score  Nervous, Anxious, on Edge 0  Control/stop worrying 0  Worry too much - different things 0  Trouble relaxing 0  Restless 0  Easily annoyed or irritable 0  Afraid - awful might happen 0  Total GAD 7 Score 0    Assessment and Plan:  Pregnancy: G3P1011 at [redacted]w[redacted]d  1. Supervision of high risk pregnancy, antepartum (Primary) Had anatomy US  today, read not back yet Pt reports some kind of cyst but she cannot recall what the name was, they told her was common and not to worry, will f/u to see if she needs f/u growth -pt also reports she was told at [facility] to take baby aspirin but she has not been, reviewed risk factors, she does not meet criteria  2. History of preterm delivery, currently pregnant [redacted] weeks vacuum assisted for FTP  3. [redacted] weeks gestation of pregnancy  4. Language barrier Engineer, structural used   Preterm labor symptoms and general obstetric precautions including but not limited to vaginal bleeding, contractions, leaking of fluid and fetal movement were reviewed in detail with the patient. Please refer to After Visit Summary for other counseling recommendations.   Return in about 1 month (around 10/11/2024) for high OB.  No future appointments.  Burnard CHRISTELLA Moats, MD

## 2024-10-16 ENCOUNTER — Ambulatory Visit: Payer: Self-pay | Admitting: Obstetrics and Gynecology

## 2024-10-16 ENCOUNTER — Other Ambulatory Visit: Payer: Self-pay

## 2024-10-16 VITALS — BP 99/63 | HR 80 | Wt 167.0 lb

## 2024-10-16 DIAGNOSIS — Z3A24 24 weeks gestation of pregnancy: Secondary | ICD-10-CM

## 2024-10-16 DIAGNOSIS — O0992 Supervision of high risk pregnancy, unspecified, second trimester: Secondary | ICD-10-CM

## 2024-10-16 NOTE — Progress Notes (Signed)
" ° °  PRENATAL VISIT NOTE  Subjective:  Alyssa Morrison is a 24 y.o. G3P1011 at [redacted]w[redacted]d being seen today for ongoing prenatal care.  She is currently monitored for the following issues for this low-risk pregnancy and has Supervision of high risk pregnancy, antepartum; Language barrier; H/O delivery by vacuum extraction, currently pregnant; and Choroid plexus cyst of fetus affecting care of mother, antepartum on their problem list.  Patient reports no complaints.  Contractions: Not present. Vag. Bleeding: None.  Movement: Present. Denies leaking of fluid.   The following portions of the patient's history were reviewed and updated as appropriate: allergies, current medications, past family history, past medical history, past social history, past surgical history and problem list.   Objective:   Vitals:   10/16/24 1519  BP: 99/63  Pulse: 80  Weight: 167 lb (75.8 kg)    Fetal Status:  Fetal Heart Rate (bpm): 141 Fundal Height: 25 cm Movement: Present    General: Alert, oriented and cooperative. Patient is in no acute distress.  Skin: Skin is warm and dry. No rash noted.   Cardiovascular: Normal heart rate noted  Respiratory: Normal respiratory effort, no problems with respiration noted  Abdomen: Soft, gravid, appropriate for gestational age.  Pain/Pressure: Present (round ligament pain)     Pelvic: Cervical exam deferred        Extremities: Normal range of motion.  Edema: None  Mental Status: Normal mood and affect. Normal behavior. Normal judgment and thought content.      08/13/2024    4:22 PM  Depression screen PHQ 2/9  Decreased Interest 0  Down, Depressed, Hopeless 0  PHQ - 2 Score 0  Altered sleeping 0  Tired, decreased energy 0  Change in appetite 0  Feeling bad or failure about yourself  0  Trouble concentrating 0  Moving slowly or fidgety/restless 0  Suicidal thoughts 0  PHQ-9 Score 0      Data saved with a previous flowsheet row definition        08/13/2024     4:22 PM  GAD 7 : Generalized Anxiety Score  Nervous, Anxious, on Edge 0  Control/stop worrying 0  Worry too much - different things 0  Trouble relaxing 0  Restless 0  Easily annoyed or irritable 0  Afraid - awful might happen 0  Total GAD 7 Score 0    Assessment and Plan:  Pregnancy: G3P1011 at [redacted]w[redacted]d 28wk labs next visit  Preterm labor symptoms and general obstetric precautions including but not limited to vaginal bleeding, contractions, leaking of fluid and fetal movement were reviewed in detail with the patient. Please refer to After Visit Summary for other counseling recommendations.   No follow-ups on file.  Future Appointments  Date Time Provider Department Center  11/08/2024  8:20 AM [facility]  11/08/2024  8:55 AM Lola Donnice HERO, MD [facility]  11/21/2024  2:35 PM Zina Jerilynn LABOR, MD [facility]  12/05/2024  2:35 PM Eveline Lynwood MATSU, MD [facility]    Bebe Furry, MD  "

## 2024-11-08 ENCOUNTER — Encounter: Payer: Self-pay | Admitting: Family Medicine

## 2024-11-08 ENCOUNTER — Other Ambulatory Visit: Payer: Self-pay

## 2024-11-08 ENCOUNTER — Ambulatory Visit: Payer: Self-pay | Admitting: Family Medicine

## 2024-11-08 VITALS — BP 98/62 | HR 80 | Wt 172.0 lb

## 2024-11-08 DIAGNOSIS — Z758 Other problems related to medical facilities and other health care: Secondary | ICD-10-CM

## 2024-11-08 DIAGNOSIS — Z3A28 28 weeks gestation of pregnancy: Secondary | ICD-10-CM

## 2024-11-08 DIAGNOSIS — O0990 Supervision of high risk pregnancy, unspecified, unspecified trimester: Secondary | ICD-10-CM

## 2024-11-08 DIAGNOSIS — O3503X0 Maternal care for (suspected) central nervous system malformation or damage in fetus, choroid plexus cysts, not applicable or unspecified: Secondary | ICD-10-CM

## 2024-11-08 DIAGNOSIS — Z603 Acculturation difficulty: Secondary | ICD-10-CM

## 2024-11-08 DIAGNOSIS — O0993 Supervision of high risk pregnancy, unspecified, third trimester: Secondary | ICD-10-CM

## 2024-11-08 DIAGNOSIS — Z3A24 24 weeks gestation of pregnancy: Secondary | ICD-10-CM

## 2024-11-08 NOTE — Progress Notes (Signed)
" ° °  Subjective:  Alyssa Morrison is a 24 y.o. G3P1011 at [redacted]w[redacted]d being seen today for ongoing prenatal care.  She is currently monitored for the following issues for this low-risk pregnancy and has Supervision of high risk pregnancy, antepartum; Language barrier; H/O delivery by vacuum extraction, currently pregnant; and Choroid plexus cyst of fetus affecting care of mother, antepartum on their problem list.  Patient reports no complaints.  Contractions: Not present. Vag. Bleeding: None.  Movement: Present. Denies leaking of fluid.   The following portions of the patient's history were reviewed and updated as appropriate: allergies, current medications, past family history, past medical history, past social history, past surgical history and problem list. Problem list updated.  Objective:   Vitals:   11/08/24 0848  BP: 98/62  Pulse: 80  Weight: 172 lb (78 kg)    Fetal Status: Fetal Heart Rate (bpm): 150 Fundal Height: 28 cm Movement: Present     General:  Alert, oriented and cooperative. Patient is in no acute distress.  Skin: Skin is warm and dry. No rash noted.   Cardiovascular: Normal heart rate noted  Respiratory: Normal respiratory effort, no problems with respiration noted  Abdomen: Soft, gravid, appropriate for gestational age. Pain/Pressure: Absent     Pelvic: Vag. Bleeding: None     Cervical exam deferred        Extremities: Normal range of motion.     Mental Status: Normal mood and affect. Normal behavior. Normal judgment and thought content.   Urinalysis:      Assessment and Plan:  Pregnancy: G3P1011 at [redacted]w[redacted]d  1. [redacted] weeks gestation of pregnancy (Primary)   2. Supervision of high risk pregnancy, antepartum BP and FHR normal FH appropriate Third trimester labs today TDAP to be given at [facility], letter provided Having some increased discharge but no large gushes of fluid, discussed this is normal with advancing gestational age Helped her activate MyChart Discussed  location of [facility] entrance  3. Choroid plexus cyst of fetus affecting care of mother, antepartum, single or unspecified fetus No f/u required per MFM  4. Language barrier Spanish   Preterm labor symptoms and general obstetric precautions including but not limited to vaginal bleeding, contractions, leaking of fluid and fetal movement were reviewed in detail with the patient. Please refer to After Visit Summary for other counseling recommendations.  Return in 2 weeks (on 11/22/2024) for ob visit, [facility].   Lola Donnice HERO, MD "

## 2024-11-08 NOTE — Patient Instructions (Signed)
 Anticonceptivos: opciones Birth Control: The Options Los mtodos anticonceptivos tambin se denominan anticonceptivos. Los anticonceptivos previenen el embarazo. Hay muchos tipos de anticonceptivos. Trabaje con el mdico para encontrar la opcin ms adecuada para usted. Anticonceptivos que utilizan hormonas Estos tipos de anticonceptivos contienen hormonas para prevenir el embarazo. Implante anticonceptivo Este es un pequeo tubo que se coloca dentro de la piel del brazo. El tubo insurance claims handler colocado durante 3 aos como mximo. Inyeccin anticonceptiva Son inyecciones que se aplican cada 3 meses. Pldoras anticonceptivas Esta es una pldora que se toma todos [place]. Debe tomarla a la [employer]. Parche anticonceptivo Este es un parche que se coloca sobre la piel. Se debe cambiar 1 vez por semana durante 3 semanas. Despus de sysco, el parche se debe retirar durante 1 semana. Anillo vaginal  Este es un anillo de plstico blando que se coloca dentro de la vagina. El anillo se deja colocado durante 3 semanas. Luego, se debe retirar durante 1 semana. Despus se coloca un nuevo anillo. Mtodos de barrera  Preservativo masculino Es una cubierta delgada que se coloca sobre el pene antes de [place]. El preservativo se desecha despus de doctor, hospital. Preservativo femenino Es una cubierta blanda y suelta que se coloca en la vagina antes de [place]. El preservativo se desecha despus de doctor, hospital. Diafragma El diafragma es una barrera blanda con forma de tazn. Debe estar hecho para adaptarse a su cuerpo. Se coloca en la vagina antes de tener sexo con una sustancia qumica que destruye los espermatozoides llamada espermicida. El designer, fashion/clothing en la vagina durante 6 a 8 horas despus de tener sexo y debe retirarse en un plazo de 24 horas. El diafragma se debe reemplazar: Cada 1 o 2 aos. Despus de dar a luz. Despus de aumentar ms de 15 libras (6.8  kg). Si se somete a una ciruga en la pelvis. Capuchn cervical Este es un capuchn pequeo y [place] se fija sobre el cuello uterino. El cuello uterino es la parte ms baja del tero. Se coloca en la vagina antes del sexo, junto con un espermicida. El capuchn debe fabricarse para usted. El capuchn se debe dejar colocado durante 6 a 8 horas despus del sexo. Se debe retirar en un plazo de 48 horas. El capuchn cervical debe ser recetado y adaptado a su cuerpo por un mdico. Debe reemplazarse cada 2 aos. Esponja Esta es una esponja pequea que se coloca en la vagina antes de [place]. Se debe dejar colocada durante al menos 6 horas despus de [employer]. Se debe retirar en un plazo de 30 horas y desecharse. Espermicidas Son sustancias qumicas que destruyen o impiden que los espermatozoides ingresen al tero. Se pueden presentar en forma de pldora, crema, gel o espuma que se debe colocar en la vagina. Se deben usar al menos de 10 a 15 minutos antes de e[employer]. Dispositivo intrauterino Un dispositivo intrauterino (DIU) es un dispositivo que un mdico coloca dentro del tero. Existen dos tipos: DIU hormonal. Este tipo puede permanecer colocado durante 3 a 5 aos. DIU de cobre. Este tipo puede permanecer colocado durante 10 aos. Mtodos anticonceptivos permanentes Ligadura de trompas en la mujer Es una ciruga para obstruir las trompas de [place]. Esterilizacin masculina Es una ciruga, llamada vasectoma, para ligar los conductos que transportan los espermatozoides en los hombres. Este mtodo funciona al cabo de 3 meses. Se deben usar otros mtodos anticonceptivos durante 3 meses. Mtodos de planificacin natural  Esto significa no [employer]x communications la pareja femenina podra quedar embarazada. A continuacin se mencionan algunos mtodos anticonceptivos por planificacin natural: Usar un calendario a fin de: Hacer un seguimiento de la duracin de cada ciclo  menstrual. Determinar en [employer] se podra producir el embarazo. Planificar no tener [employer] en que se podra producir firefighter. Reconocer los signos de la ovulacin y no tener relaciones sexuales durante ese perodo. La pareja femenina puede detectar cundo ser la ovulacin haciendo un seguimiento de su temperatura todos [place]. Tambin puede examinar si hay cambios en la mucosidad que proviene del cuello uterino. Dnde obtener ms informacin Centers for Disease Control and Prevention (Centros para el Control y la Prevencin de Enfermedades): tonerpromos.no. Luego: Introduzca birth control o anticonceptivos en el cuadro de bsqueda. Esta informacin no tiene theme park manager el consejo del mdico. Asegrese de hacerle al mdico cualquier pregunta que tenga. Document Revised: 06/02/2023 Document Reviewed: 06/02/2023 Elsevier Patient Education  2025 [employer].

## 2024-11-09 LAB — CBC
Hematocrit: 36.3 % (ref 34.0–46.6)
Hemoglobin: 12.6 g/dL (ref 11.1–15.9)
MCH: 32.8 pg (ref 26.6–33.0)
MCHC: 34.7 g/dL (ref 31.5–35.7)
MCV: 95 fL (ref 79–97)
Platelets: 259 10*3/uL (ref 150–450)
RBC: 3.84 x10E6/uL (ref 3.77–5.28)
RDW: 12.1 % (ref 11.7–15.4)
WBC: 10.3 10*3/uL (ref 3.4–10.8)

## 2024-11-09 LAB — HIV ANTIBODY (ROUTINE TESTING W REFLEX): HIV Screen 4th Generation wRfx: NONREACTIVE

## 2024-11-09 LAB — SYPHILIS: RPR W/REFLEX TO RPR TITER AND TREPONEMAL ANTIBODIES, TRADITIONAL SCREENING AND DIAGNOSIS ALGORITHM: RPR Ser Ql: NONREACTIVE

## 2024-11-09 LAB — GLUCOSE TOLERANCE, 2 HOURS W/ 1HR
Glucose, 1 hour: 94 mg/dL (ref 70–179)
Glucose, 2 hour: 69 mg/dL — ABNORMAL LOW (ref 70–152)
Glucose, Fasting: 68 mg/dL — ABNORMAL LOW (ref 70–91)

## 2024-11-11 ENCOUNTER — Ambulatory Visit: Payer: Self-pay | Admitting: Obstetrics and Gynecology

## 2024-11-11 DIAGNOSIS — O0990 Supervision of high risk pregnancy, unspecified, unspecified trimester: Secondary | ICD-10-CM

## 2024-11-21 ENCOUNTER — Encounter: Payer: Self-pay | Admitting: Obstetrics and Gynecology

## 2024-12-05 ENCOUNTER — Encounter: Payer: Self-pay | Admitting: Obstetrics & Gynecology

## 2024-12-19 ENCOUNTER — Encounter: Payer: Self-pay | Admitting: Obstetrics and Gynecology

## 2025-01-01 ENCOUNTER — Encounter: Payer: Self-pay | Admitting: Obstetrics & Gynecology

## 2025-01-08 ENCOUNTER — Encounter: Payer: Self-pay | Admitting: Obstetrics and Gynecology
